# Patient Record
Sex: Female | Born: 1970 | Hispanic: No | Marital: Married | Smoking: Never smoker
Health system: Southern US, Community
[De-identification: ages and names within clinical notes are randomized; demographics above are authoritative.]

## PROBLEM LIST (undated history)

## (undated) DIAGNOSIS — E7800 Pure hypercholesterolemia, unspecified: Secondary | ICD-10-CM

## (undated) DIAGNOSIS — E139 Other specified diabetes mellitus without complications: Secondary | ICD-10-CM

## (undated) DIAGNOSIS — M545 Low back pain, unspecified: Secondary | ICD-10-CM

## (undated) HISTORY — DX: Pure hypercholesterolemia, unspecified: E78.00

## (undated) HISTORY — DX: Other specified diabetes mellitus without complications: E13.9

## (undated) HISTORY — DX: Low back pain, unspecified: M54.50

---

## 2019-05-02 ENCOUNTER — Emergency Department (HOSPITAL_BASED_OUTPATIENT_CLINIC_OR_DEPARTMENT_OTHER)
Admission: EM | Admit: 2019-05-02 | Discharge: 2019-05-02 | Disposition: A | Discharge status: Home / Self Care | Payer: No Typology Code available for payment source | Attending: Emergency Medicine | Admitting: Emergency Medicine

## 2019-05-02 ENCOUNTER — Emergency Department (HOSPITAL_BASED_OUTPATIENT_CLINIC_OR_DEPARTMENT_OTHER): Payer: No Typology Code available for payment source

## 2019-05-02 ENCOUNTER — Other Ambulatory Visit: Payer: Self-pay

## 2019-05-02 ENCOUNTER — Encounter (HOSPITAL_BASED_OUTPATIENT_CLINIC_OR_DEPARTMENT_OTHER): Payer: Self-pay | Admitting: *Deleted

## 2019-05-02 DIAGNOSIS — S5012XA Contusion of left forearm, initial encounter: Secondary | ICD-10-CM | POA: Diagnosis not present

## 2019-05-02 DIAGNOSIS — S01112A Laceration without foreign body of left eyelid and periocular area, initial encounter: Secondary | ICD-10-CM | POA: Diagnosis not present

## 2019-05-02 DIAGNOSIS — Z79899 Other long term (current) drug therapy: Secondary | ICD-10-CM | POA: Insufficient documentation

## 2019-05-02 DIAGNOSIS — S161XXA Strain of muscle, fascia and tendon at neck level, initial encounter: Secondary | ICD-10-CM | POA: Diagnosis not present

## 2019-05-02 DIAGNOSIS — S0990XA Unspecified injury of head, initial encounter: Secondary | ICD-10-CM | POA: Diagnosis present

## 2019-05-02 DIAGNOSIS — W19XXXA Unspecified fall, initial encounter: Secondary | ICD-10-CM

## 2019-05-02 DIAGNOSIS — Y929 Unspecified place or not applicable: Secondary | ICD-10-CM | POA: Insufficient documentation

## 2019-05-02 DIAGNOSIS — W3182XA Contact with other commercial machinery, initial encounter: Secondary | ICD-10-CM | POA: Insufficient documentation

## 2019-05-02 DIAGNOSIS — Z7984 Long term (current) use of oral hypoglycemic drugs: Secondary | ICD-10-CM | POA: Diagnosis not present

## 2019-05-02 DIAGNOSIS — Y990 Civilian activity done for income or pay: Secondary | ICD-10-CM | POA: Insufficient documentation

## 2019-05-02 DIAGNOSIS — S300XXA Contusion of lower back and pelvis, initial encounter: Secondary | ICD-10-CM | POA: Diagnosis not present

## 2019-05-02 DIAGNOSIS — Y9389 Activity, other specified: Secondary | ICD-10-CM | POA: Insufficient documentation

## 2019-05-02 DIAGNOSIS — S40022A Contusion of left upper arm, initial encounter: Secondary | ICD-10-CM

## 2019-05-02 DIAGNOSIS — S0181XA Laceration without foreign body of other part of head, initial encounter: Secondary | ICD-10-CM

## 2019-05-02 MED ORDER — METHOCARBAMOL 1000 MG/10ML IJ SOLN
500.0000 mg | Freq: Once | INTRAMUSCULAR | Status: AC
  Administered 2019-05-02: 500 mg via INTRAMUSCULAR
  Filled 2019-05-02: qty 10

## 2019-05-02 MED ORDER — IBUPROFEN 800 MG PO TABS
800.0000 mg | ORAL_TABLET | Freq: Three times a day (TID) | ORAL | 0 refills | Status: AC
Start: 2019-05-02 — End: ?

## 2019-05-02 MED ORDER — METHOCARBAMOL 500 MG PO TABS
500.0000 mg | ORAL_TABLET | Freq: Three times a day (TID) | ORAL | 0 refills | Status: AC | PRN
Start: 2019-05-02 — End: ?

## 2019-05-02 MED ORDER — KETOROLAC TROMETHAMINE 60 MG/2ML IM SOLN
60.0000 mg | Freq: Once | INTRAMUSCULAR | Status: AC
  Administered 2019-05-02: 15:00:00 60 mg via INTRAMUSCULAR
  Filled 2019-05-02: qty 2

## 2019-05-02 MED FILL — IBUPROFEN 800 MG TAB: 800 | 7 days supply | Qty: 21 | Fill #0

## 2019-05-02 MED FILL — METHOCARBAMOL 500 MG TABLET: 500 | 6 days supply | Qty: 20 | Fill #0

## 2019-05-02 NOTE — ED Provider Notes (Signed)
[facility] EMERGENCY DEPARTMENT Provider Note   CSN: [id number] Arrival date & time: 05/02/19  1158     History Chief Complaint  Patient presents with  . Fall    Sue Rhodes is a 49 y.o. female.  HPI Patient was at work.  She works feeding boxes into a machine.  Apparently there was a kick back that pushed her off balance.  She fell backwards and hit her left brow on a metal pole and fell backwards and hit her head on the ground.  She was dazed for about 30 seconds.  We will was then around her.  He was helped up into a chair.  She reports she has a lot of pain over much of the left side of her body.  She landed on her left shoulder and hip area.  She is already noticing bruising on the left arm that is uncomfortable.  She reports she has some headache at the back of her head.  She endorses pain at the back of her neck as well.  Is not on any blood thinners.    History reviewed. No pertinent past medical history.  There are no problems to display for this patient.   History reviewed. No pertinent surgical history.   OB History   No obstetric history on file.     No family history on file.  Social History   Tobacco Use  . Smoking status: Never Smoker  . Smokeless tobacco: Never Used  Substance Use Topics  . Alcohol use: Never  . Drug use: Never    Home Medications Prior to Admission medications   Medication Sig Start Date End Date Taking? Authorizing Provider  Blood Glucose Monitoring Suppl (GLUCOCOM BLOOD GLUCOSE MONITOR) DEVI Use as directed 03/05/18  Yes [provider]  dapagliflozin propanediol (FARXIGA) 5 MG TABS tablet Take by mouth. 06/13/18  Yes [provider]  etodolac (LODINE) 400 MG tablet Take by mouth. 09/13/17  Yes [provider]  ferrous sulfate 325 (65 FE) MG tablet Take by mouth. 06/13/18  Yes [provider]  fluticasone (FLONASE) 50 MCG/ACT nasal spray Place into the nose. 03/20/18  Yes [provider]  loratadine (CLARITIN) 10 MG tablet Take by mouth. 03/15/18  Yes [provider]  metFORMIN (GLUCOPHAGE-XR) 500 MG 24 hr tablet Take by mouth. 06/13/18  Yes [provider]  methocarbamol (ROBAXIN) 500 MG tablet Take 1 tablet twice per day as needed for spasms. 09/13/17  Yes [provider]  nortriptyline (PAMELOR) 10 MG capsule Take 2 tablets (20 mg) at night for headache. 09/11/18  Yes [provider]  rizatriptan (MAXALT) 10 MG tablet Take by mouth. 03/20/19  Yes [provider]  butalbital-acetaminophen-caffeine (FIORICET WITH CODEINE) 50-325-40-30 MG capsule Take 1 tablet po every 6 hours prn for headaches    [provider]    Allergies    Patient has no known allergies.  Review of Systems   Review of Systems 10 Systems reviewed and are negative for acute change except as noted in the HPI.  Physical Exam Updated Vital Signs BP 135/85 (BP Location: Right Arm)   Pulse 78   Temp 97.9 F (36.6 C) (Oral)   Resp 18   Ht 5' (1.524 m)   Wt 78.5 kg   SpO2 100%   BMI 33.79 kg/m   Physical Exam Constitutional:      Comments: Alert and appropriate.  GCS is 15.  No respiratory distress.  Patient has cervical collar  in place.  HENT:     Head:     Comments: 1 cm nonbleeding laceration to the left brow.  There is slight amount of gaping once this is cleaned.    Nose: Nose normal.     Mouth/Throat:     Mouth: Mucous membranes are moist.     Pharynx: Oropharynx is clear.  Eyes:     Extraocular Movements: Extraocular movements intact.     Conjunctiva/sclera: Conjunctivae normal.     Pupils: Pupils are equal, round, and reactive to light.  Neck:     Comments: Cervical collar maintained in place.  Patient endorses discomfort to palpation along the posterior cervical spine diffusely as well as the left paraspinous muscle bodies. Cardiovascular:     Rate and Rhythm: Normal rate and regular rhythm.     Pulses: Normal pulses.       Heart sounds: Normal heart sounds.  Pulmonary:     Effort: Pulmonary effort is normal.     Breath sounds: Normal breath sounds.     Comments: Patient dorsa some discomfort to compression of the chest wall.  No crepitus. Abdominal:     General: There is no distension.     Palpations: Abdomen is soft.     Tenderness: There is no abdominal tenderness. There is no guarding.  Musculoskeletal:     Comments: Diffuse ecchymotic bruising of the dorsal aspect of the forearm.  No lacerations or abrasions.  Patient has intact normal range of motion at the wrist hand elbow and shoulder.  Normal intact range of motion of hip knee and ankle.  No effusions or deformities present.  Skin:    General: Skin is warm and dry.  Neurological:     General: No focal deficit present.     Mental Status: She is oriented to person, place, and time.     Motor: No weakness.     Coordination: Coordination normal.  Psychiatric:        Mood and Affect: Mood normal.     ED Results / Procedures / Treatments   Labs (all labs ordered are listed, but only abnormal results are displayed) Labs Reviewed - No data to display  EKG None  Radiology CT Head Wo Contrast  Result Date: 05/02/2019 CLINICAL DATA:  MRI/MRA head 03/14/2018. EXAM: CT HEAD WITHOUT CONTRAST CT CERVICAL SPINE WITHOUT CONTRAST TECHNIQUE: Multidetector CT imaging of the head and cervical spine was performed following the standard protocol without intravenous contrast. Multiplanar CT image reconstructions of the cervical spine were also generated. COMPARISON:  Noncontrast head CT 02/01/2018. FINDINGS: CT HEAD FINDINGS Brain: There is no acute intracranial hemorrhage. No demarcated cortical infarct. No extra-axial fluid collection. No evidence of intracranial mass. No midline shift. Vascular: A persistent right trigeminal artery was better appreciated on prior MRA 03/14/2018 (anatomic variant). Expected proximal arterial flow voids. Skull: Normal. Negative  for fracture or focal lesion. Sinuses/Orbits: Visualized orbits show no acute finding. Minimal ethmoid sinus mucosal thickening. No significant mastoid effusion. CT CERVICAL SPINE FINDINGS Alignment: Straightening of the expected cervical lordosis. Trace C2-C3 and C3-C4 retrolisthesis. Skull base and vertebrae: The basion-dental and atlanto-dental intervals are maintained.No evidence of acute fracture to the cervical spine. Soft tissues and spinal canal: No prevertebral fluid or swelling. No visible canal hematoma. Disc levels: No significant bony spinal canal or neural foraminal narrowing at any level. Upper chest: No consolidation within the imaged lung apices. No visible pneumothorax. IMPRESSION: CT head: 1. No evidence of acute intracranial abnormality. 2. Minimal ethmoid sinus  mucosal thickening. CT cervical spine: 1. No evidence of acute fracture to the cervical spine. 2. Minimal C2-C3 and C3-C4 grade 1 retrolisthesis. Electronically Signed   By: Kellie Simmering DO   On: 05/02/2019 13:38   CT Cervical Spine Wo Contrast  Result Date: 05/02/2019 CLINICAL DATA:  MRI/MRA head 03/14/2018. EXAM: CT HEAD WITHOUT CONTRAST CT CERVICAL SPINE WITHOUT CONTRAST TECHNIQUE: Multidetector CT imaging of the head and cervical spine was performed following the standard protocol without intravenous contrast. Multiplanar CT image reconstructions of the cervical spine were also generated. COMPARISON:  Noncontrast head CT 02/01/2018. FINDINGS: CT HEAD FINDINGS Brain: There is no acute intracranial hemorrhage. No demarcated cortical infarct. No extra-axial fluid collection. No evidence of intracranial mass. No midline shift. Vascular: A persistent right trigeminal artery was better appreciated on prior MRA 03/14/2018 (anatomic variant). Expected proximal arterial flow voids. Skull: Normal. Negative for fracture or focal lesion. Sinuses/Orbits: Visualized orbits show no acute finding. Minimal ethmoid sinus mucosal thickening. No  significant mastoid effusion. CT CERVICAL SPINE FINDINGS Alignment: Straightening of the expected cervical lordosis. Trace C2-C3 and C3-C4 retrolisthesis. Skull base and vertebrae: The basion-dental and atlanto-dental intervals are maintained.No evidence of acute fracture to the cervical spine. Soft tissues and spinal canal: No prevertebral fluid or swelling. No visible canal hematoma. Disc levels: No significant bony spinal canal or neural foraminal narrowing at any level. Upper chest: No consolidation within the imaged lung apices. No visible pneumothorax. IMPRESSION: CT head: 1. No evidence of acute intracranial abnormality. 2. Minimal ethmoid sinus mucosal thickening. CT cervical spine: 1. No evidence of acute fracture to the cervical spine. 2. Minimal C2-C3 and C3-C4 grade 1 retrolisthesis. Electronically Signed   By: Kellie Simmering DO   On: 05/02/2019 13:38   DG Chest Port 1 View  Result Date: 05/02/2019 CLINICAL DATA:  Fall. Additional history provided: Patient fell this morning at work, reports neck pain. EXAM: PORTABLE CHEST 1 VIEW COMPARISON:  Chest radiograph 03/20/2018 FINDINGS: Heart size within normal limits. There is no appreciable airspace consolidation. No evidence of pleural effusion or pneumothorax. No acute bony abnormality identified. IMPRESSION: No evidence of acute cardiopulmonary abnormality. Electronically Signed   By: Kellie Simmering DO   On: 05/02/2019 13:29    Procedures .Marland KitchenLaceration Repair  Date/Time: 05/02/2019 3:13 PM Performed by: Charlesetta Shanks, MD Authorized by: Charlesetta Shanks, MD   Consent:    Consent obtained:  Verbal   Consent given by:  Patient   Risks discussed:  Infection and poor cosmetic result Anesthesia (see MAR for exact dosages):    Anesthesia method:  None Laceration details:    Location:  Face   Face location:  L eyebrow   Length (cm):  1   Depth (mm):  3 Repair type:    Repair type:  Simple Treatment:    Area cleansed with:  Shur-Clens    Amount of cleaning:  Standard Skin repair:    Repair method:  Tissue adhesive Approximation:    Approximation:  Close Post-procedure details:    Dressing:  Open (no dressing)   (including critical care time)  Medications Ordered in ED Medications  ketorolac (TORADOL) injection 60 mg (60 mg Intramuscular Given 05/02/19 1435)  methocarbamol (ROBAXIN) injection 500 mg (500 mg Intramuscular Given 05/02/19 1435)    ED Course  I have reviewed the triage vital signs and the nursing notes.  Pertinent labs & imaging results that were available during my care of the patient were reviewed by me and considered in my medical decision making (  see chart for details).    MDM Rules/Calculators/A&P                      Patient had a fall from standing position.  She had brief possible loss of consciousness.  Mental status is normal.  Patient is not anticoagulated.  CT head shows no acute findings.  She did also endorse pain in the back of the neck.  CT cervical spine does not identify any fractures.  Patient is neurologically intact.  He has some bruising to the forearm and tenderness on the left flank area but normal range of motion without clinical appearance of fractures or effusions at joints.  At this time recommendations for symptomatic treatment with ibuprofen, elevation and icing.  Return precautions provided. Final Clinical Impression(s) / ED Diagnoses Final diagnoses:  Fall, initial encounter  Minor head injury, initial encounter  Facial laceration, initial encounter  Acute strain of neck muscle, initial encounter  Contusion of left upper extremity, initial encounter  Contusion of lower back, initial encounter    Rx / DC Orders ED Discharge Orders    None       Arby Barrette, MD 05/02/19 1517

## 2019-05-02 NOTE — ED Triage Notes (Signed)
At work this am she fell. Laceration to her left eyebrow. Pain to her left forearm, knee and hip. She had LOC. She is alert and oriented on arrival. She had a UDS at occupational health.

## 2019-05-02 NOTE — ED Notes (Signed)
ED Provider at bedside.  Utilizing dermabond for wound closure.

## 2019-05-02 NOTE — ED Notes (Signed)
Small hard C collar applied at triage.

## 2019-05-02 NOTE — Discharge Instructions (Signed)
1.  Apply ice packs to areas that are bruised and swollen.  Try to elevate your arms or legs in areas that are swollen. 2.  Take ibuprofen for pain and Robaxin for muscle relaxer. 3.  Follow instructions for head injury and neck strain. 4.  Return to the emergency department if you have worsening or concerning symptoms. 5.  See your doctor for recheck in the next 2 to 3 days.

## 2019-05-02 NOTE — ED Notes (Signed)
ED Provider at bedside. 

## 2021-04-07 ENCOUNTER — Encounter: Payer: Self-pay | Admitting: Neurology

## 2021-04-08 ENCOUNTER — Other Ambulatory Visit (HOSPITAL_BASED_OUTPATIENT_CLINIC_OR_DEPARTMENT_OTHER): Payer: Self-pay | Admitting: Family Medicine

## 2021-04-08 DIAGNOSIS — Z1231 Encounter for screening mammogram for malignant neoplasm of breast: Secondary | ICD-10-CM

## 2021-04-11 ENCOUNTER — Ambulatory Visit (HOSPITAL_BASED_OUTPATIENT_CLINIC_OR_DEPARTMENT_OTHER)
Admission: RE | Admit: 2021-04-11 | Discharge: 2021-04-11 | Disposition: A | Discharge status: Home / Self Care | Payer: Medicaid Other | Source: Ambulatory Visit | Attending: Family Medicine | Admitting: Family Medicine

## 2021-04-11 ENCOUNTER — Encounter (HOSPITAL_BASED_OUTPATIENT_CLINIC_OR_DEPARTMENT_OTHER): Payer: Self-pay

## 2021-04-11 DIAGNOSIS — Z1231 Encounter for screening mammogram for malignant neoplasm of breast: Secondary | ICD-10-CM | POA: Insufficient documentation

## 2021-08-23 NOTE — Progress Notes (Unsigned)
NEUROLOGY CONSULTATION NOTE  Kevin Dorothee Napierkowski MRN: [id number] DOB: 1970-07-24  Referring provider: Payton Emerald, MD Primary care provider: Dannielle Karvonen, FNP  Reason for consult:  migraine  Assessment/Plan:   Migraine without aura, without status migrainosus, intractable  Right sided cervical radiculitis  Refer to physical therapy for neck pain and right sided cervical radiculitis Migraine prevention:  Restart nortriptyline 10mg  at bedtime (previously effective) Migraine rescue:  sumatriptan 50mg  and Zofran 4mg  Limit use of pain relievers to no more than 2 days out of week to prevent risk of rebound or medication-overuse headache. Keep headache diary Follow up 4 months.    Subjective:  Sue Rhodes is a 51 year old female with DM II and lumbar spinal stenosis who presents for migraines.  History supplemented by her accompanying son, referring provider's and prior neurologist's notes.  Interpreter is present.    Onset: Over 10 years, worsened in 2020 presenting with status migrainosus.  She did have a fall in 2020 hitting her head but caused back pain.   Location:  diffuse, right sided, (sometimes left sided), right retro-orbital sometimes radiating down the neck into right shoulder and down into all of the fingers.  No numbness or weakness.. Quality:  pressure, sharp, pounding Intensity:  gradual or sudden onset -severe.   Aura:  absent Prodrome:  absent Associated symptoms:  Nausea, vomiting, photophobia, phonophobia, sometimes dizziness and blurred vision.  She denies associated autonomic symptoms or unilateral numbness or weakness. Duration:  3-4 days Frequency:  every 4 to 5 days. Frequency of abortive medication: Tylenol or ibuprofen 3-4 times a week Triggers:  moving right arm/shoulder Relieving factors:  none Activity:  aggravates Treats headaches now with Tylenol  Followed by neurology at [facility] from 2020 to 2021.  At the time, she was doing  well on nortriptyline 30mg  at bedtime and rizatriptan 10mg  which helped but rizatriptan eventually caused chest pain.  Has seen ophthalmology with exams negative for papilledema.    Work-up: 03/14/2018 MRI BRAIN WO:  Normal 03/14/2018 MRA HEAD:  1. No emergent large vessel occlusion or flow-limiting stenosis.  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/02/2019 CT C-SPINE:  Minimal C2-C3 and C3-C4 grade 1 retrolisthesis  Past NSAIDS/analgesics:  Fioricet, ibuprofen, naproxen, etodolac, acetaminophen, tramadol Past abortive triptans:  rizatriptan 10mg  (helpful but caused chest pain) Past abortive ergotamine:  none Past muscle relaxants:  methocarbamol, cyclobenzaprine 10mg  QHS PRN (pain) Past anti-emetic:  promethazine Past antihypertensive medications:  none Past antidepressant medications:  nortriptyline 30mg  (effective), amitriptyline Past anticonvulsant medications:  topiramate Past anti-CGRP:  none Past antihistamines/decongestants:  Flonase, loratadine Other past therapies:  none  Current NSAIDS/analgesics:  Tylenol, Celebrex Current triptans:  none Current ergotamine:  none Current anti-emetic:  none Current muscle relaxants:  none Current Antihypertensive medications:  none Current Antidepressant medications:  none Current Anticonvulsant medications:  gabapentin 300mg  TID PRN (pain) Current anti-CGRP:  none Current Vitamins/Herbal/Supplements:  none Current Antihistamines/Decongestants:  none Other therapy:  none Hormone/birth control:  none   Caffeine:  1 cup coffee daily Depression/Anxiety:  yes Other pain:  back pain Sleep hygiene:  sleep schedule poor but sleeps 6-7 hours Family history of headache:  daughter      PAST MEDICAL HISTORY: Past Medical History:  Diagnosis Date   Chronic lower back pain    Diabetes 1.5, managed as type 2 (HCC)    High cholesterol      PAST SURGICAL HISTORY: No past surgical history on file.  MEDICATIONS: Current Outpatient  Medications on  File Prior to Visit  Medication Sig Dispense Refill   fluticasone (FLONASE) 50 MCG/ACT nasal spray Place into the nose.     ibuprofen (ADVIL) 800 MG tablet Take 1 tablet (800 mg total) by mouth 3 (three) times daily. 21 tablet 0   metFORMIN (GLUCOPHAGE-XR) 500 MG 24 hr tablet Take by mouth.     Blood Glucose Monitoring Suppl (GLUCOCOM BLOOD GLUCOSE MONITOR) DEVI Use as directed     butalbital-acetaminophen-caffeine (FIORICET WITH CODEINE) 50-325-40-30 MG capsule Take 1 tablet po every 6 hours prn for headaches (Patient not taking: Reported on 08/24/2021)     dapagliflozin propanediol (FARXIGA) 5 MG TABS tablet Take by mouth. (Patient not taking: Reported on 08/24/2021)     etodolac (LODINE) 400 MG tablet Take by mouth. (Patient not taking: Reported on 08/24/2021)     ferrous sulfate 325 (65 FE) MG tablet Take by mouth. (Patient not taking: Reported on 08/24/2021)     loratadine (CLARITIN) 10 MG tablet Take by mouth.     methocarbamol (ROBAXIN) 500 MG tablet Take 1 tablet twice per day as needed for spasms. (Patient not taking: Reported on 08/24/2021)     methocarbamol (ROBAXIN) 500 MG tablet Take 1 tablet (500 mg total) by mouth every 8 (eight) hours as needed for muscle spasms. (Patient not taking: Reported on 08/24/2021) 20 tablet 0   nortriptyline (PAMELOR) 10 MG capsule Take 2 tablets (20 mg) at night for headache. (Patient not taking: Reported on 08/24/2021)     rizatriptan (MAXALT) 10 MG tablet Take by mouth. (Patient not taking: Reported on 08/24/2021)     No current facility-administered medications on file prior to visit.      ALLERGIES: No Known Allergies  FAMILY HISTORY: No family history on file.  Objective:  Blood pressure 134/83, pulse 66, height 5\' 1"  (1.549 m), weight 177 lb (80.3 kg), SpO2 96 %. General: No acute distress.  Patient appears well-groomed.   Head:  Normocephalic/atraumatic Eyes:  fundi examined but not visualized Neck: supple, right sided  paraspinal tenderness, full range of motion Heart: regular rate and rhythm Neurological Exam: Mental status: alert and oriented to person, place, and time, speech fluent and not dysarthric, language intact. Cranial nerves: CN I: not tested CN II: pupils equal, round and reactive to light, visual fields intact CN III, IV, VI:  full range of motion, no nystagmus, no ptosis CN V: facial sensation intact. CN VII: upper and lower face symmetric CN VIII: hearing intact CN IX, X: gag intact, uvula midline CN XI: sternocleidomastoid and trapezius muscles intact CN XII: tongue midline Bulk & Tone: normal, no fasciculations. Motor:  muscle strength 5/5 throughout Sensation:  Pinprick, temperature and vibratory sensation intact. Deep Tendon Reflexes:  2+ throughout,  toes downgoing.   Finger to nose testing:  Without dysmetria.   Heel to shin:  Without dysmetria.   Gait:  Normal station and stride.  Romberg negative.    Thank you for allowing me to take part in the care of this patient.  , DO  CC:  Shon Millet, MD  Payton Emerald, PA-C

## 2021-08-24 ENCOUNTER — Encounter: Payer: Self-pay | Admitting: Neurology

## 2021-08-24 ENCOUNTER — Ambulatory Visit: Payer: 59 | Admitting: Neurology

## 2021-08-24 VITALS — BP 134/83 | HR 66 | Ht 61.0 in | Wt 177.0 lb

## 2021-08-24 DIAGNOSIS — G43019 Migraine without aura, intractable, without status migrainosus: Secondary | ICD-10-CM

## 2021-08-24 DIAGNOSIS — M542 Cervicalgia: Secondary | ICD-10-CM

## 2021-08-24 DIAGNOSIS — M5412 Radiculopathy, cervical region: Secondary | ICD-10-CM | POA: Diagnosis not present

## 2021-08-24 MED ORDER — SUMATRIPTAN SUCCINATE 50 MG PO TABS: ORAL_TABLET | ORAL | 5 refills | Status: AC

## 2021-08-24 MED ORDER — ONDANSETRON HCL 4 MG PO TABS: 4.0000 mg | ORAL_TABLET | Freq: Three times a day (TID) | ORAL | 5 refills | Status: AC | PRN

## 2021-08-24 MED ORDER — NORTRIPTYLINE HCL 10 MG PO CAPS: 10.0000 mg | ORAL_CAPSULE | Freq: Every day | ORAL | 5 refills | Status: AC

## 2021-08-24 NOTE — Patient Instructions (Signed)
Refer to physical therapy Start nortriptyline 10mg  at bedtime Take sumatriptan and ondansetron as needed for migraine attack and nausea Limit use of pain relievers to no more than 2 days out of week to prevent risk of rebound or medication-overuse headache. Follow up 4 months.

## 2021-08-26 ENCOUNTER — Encounter: Payer: Self-pay | Admitting: Surgery

## 2021-08-26 ENCOUNTER — Ambulatory Visit (INDEPENDENT_AMBULATORY_CARE_PROVIDER_SITE_OTHER): Payer: 59 | Admitting: Surgery

## 2021-08-26 ENCOUNTER — Ambulatory Visit: Payer: Self-pay

## 2021-08-26 VITALS — BP 118/79 | HR 75 | Ht 61.0 in | Wt 177.0 lb

## 2021-08-26 DIAGNOSIS — M4726 Other spondylosis with radiculopathy, lumbar region: Secondary | ICD-10-CM

## 2021-08-26 DIAGNOSIS — G8929 Other chronic pain: Secondary | ICD-10-CM | POA: Diagnosis not present

## 2021-08-26 DIAGNOSIS — M545 Low back pain, unspecified: Secondary | ICD-10-CM

## 2021-08-26 NOTE — Progress Notes (Signed)
Office Visit Note   Patient: Sue Rhodes           Date of Birth: 10-27-1970           MRN: [id number] Visit Date: 08/26/2021              Requested by: Meta Hatchet, PA-C [address] [place],  [place] [address] PCP: Meta Hatchet, PA-C   Assessment & Plan: Visit Diagnoses:  1. Chronic bilateral low back pain, unspecified whether sciatica present   2. Other spondylosis with radiculopathy, lumbar region     Plan: With patient's ongoing chronic low back pain and bilateral lower extremity radicular symptoms that have failed conservative treatment over several years I will schedule lumbar MRI to compare to the study that was done in 2021 and 2018.  Follow-up with Dr. Ophelia Charter in 4 weeks for recheck and to discuss results and further treatment options.  Follow-Up Instructions: Return in about 4 weeks (around 09/23/2021) for WITH DR YATES TO REVIEW LUMBAR MRI AND DISCUSS TREATMENT OPTIONS.   Orders:  Orders Placed This Encounter  Procedures   XR Lumbar Spine Complete   MR LUMBAR SPINE WO CONTRAST   No orders of the defined types were placed in this encounter.     Procedures: No procedures performed   Clinical Data: No additional findings.   Subjective: Chief Complaint  Patient presents with   Lower Back - Pain    HPI 51 year old female comes in with family member is helping as interpreter.  Patient was referred to [place] by primary care provider for ongoing chronic low back pain and bilateral lower extremity radiculopathy.  Patient has had chronic symptoms for over 5 years.  I reviewed her chart she had MRI scans lumbar spine in 2018 and 2021.  October 27, 2016 EXAM:  MRI LUMBAR SPINE WITHOUT CONTRAST   TECHNIQUE:  Multiplanar, multisequence MR imaging of the lumbar spine was  performed. No intravenous contrast was administered.   COMPARISON:  None.   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    Alignment:  Physiologic.   Vertebrae:  No fracture, evidence of discitis, or bone lesion.   Conus medullaris: Extends to the L1-L2 level and appears normal.   Paraspinal and other soft tissues: Negative.   Disc levels:   T11-T12:  Only seen on the sagittal images.  Negative.   T12-L1:  Only seen on the sagittal images.  Negative.   L1-L2:  Negative.   L2-L3:  Negative.   L3-L4: Diffuse disc bulge and bilateral facet arthropathy with  prominent posterior epidural fat resulting in moderate to severe  central spinal canal stenosis, effacement of the bilateral lateral  recesses, and mild right neuroforaminal stenosis.   L4-L5: Diffuse disc bulge and moderate bilateral facet arthropathy  resulting in moderate to severe central spinal canal stenosis,  moderate right, and mild left neuroforaminal stenosis.   L5-S1: Bilateral facet arthropathy. Prominent epidural fat. No  spinal canal or neuroforaminal stenosis.   IMPRESSION:  1. Lower lumbar degenerative changes with moderate to severe central  spinal canal stenosis at L3-L4 and L4-L5. Moderate right  neuroforaminal stenosis at L4-L5 which could impinge on the exiting  right L4 nerve root.    Electronically Signed    By: Obie Dredge M.D.    On: 10/27/2016 09:33 Specimen Collected: -- Last Resulted: --  Date: 10/27/16   Received From: [facility]    August 07, 2019: Virgina Jock II, DO - 08/07/2019  Formatting of this note might be different from the original.  MRI lumbar spine:   TECHNIQUE: Sagittal and axial T1 and T2-weighted sequences were performed. Additional sagittal STIR images were performed.   INDICATION: Lumbar strain   COMPARISON: None   FINDINGS:  #  Grade 1 retrolisthesis of L3-4 and grade 1 anterolisthesis L4-5.  #  Vertebral body heights are well maintained.  #  The marrow signal intensity is normal.  #  Conus terminates at L1 without evidence of tethering.  #  Nerve  roots appear normal.  #  Incidental findings: None.    #  L1-2: Normal.  #    #  L2-3: Normal.  #    #  L3-4: Mild degenerative disc disease. There is facet arthropathy and disc bulge causing mild central canal stenosis with mild bilateral neural foraminal narrowing  #    #  L4-5: Mild degenerative disc disease. There is facet arthropathy and disc bulge with posterior central protrusion causing moderate central canal and lateral recess stenosis with moderate bilateral neural foraminal narrowing  #    #  L5-S1: Facet arthropathy on the right without significant central canal stenosis or neuroforaminal narrowing    IMPRESSION:   1.  Lumbar spondylosis most significant at L4-5 with grade 1 anterolisthesis and disc bulge with posterior protrusion causing moderate central canal and lateral recess stenosis with moderate bilateral foraminal narrowing.   2.  L3-4 mild central canal stenosis and neuroforaminal narrowing.   Electronically Signed by: Abner Greenspan Exam End: 08/07/19 11:35    Patient states at one point she was told by an orthopedic surgeon that she would likely need surgical intervention for findings on the scan.  Patient said that she did not want to have any surgery done because he did not guarantee her "100% improvement".  Complains of constant low back pain that radiates down to both feet.  She has failed conservative treatment with Celebrex, ibuprofen, Robaxin.  Review of Systems No current complaints of cardiopulmonary GI/GU issues  Objective: Vital Signs: BP 118/79   Pulse 75   Ht 5\' 1"  (1.549 m)   Wt 177 lb (80.3 kg)   BMI 33.44 kg/m   Physical Exam HENT:     Head: Normocephalic.  Eyes:     Extraocular Movements: Extraocular movements intact.  Musculoskeletal:     Comments: Pain and limited range of motion with lumbar flexion and extension.  Positive bilateral lumbar paraspinal tenderness.  Positive bilateral static notch tenderness.  Negative logroll bilateral  hips.  Positive bilateral straight leg raise.  No focal motor deficits.  Neurological:     Mental Status: She is alert and oriented to person, place, and time.  Psychiatric:        Mood and Affect: Mood normal.     Ortho Exam  Specialty Comments:  No specialty comments available.  Imaging: No results found.   PMFS History: There are no problems to display for this patient.  Past Medical History:  Diagnosis Date   Chronic lower back pain    Diabetes 1.5, managed as type 2 (HCC)    High cholesterol     No family history on file.  No past surgical history on file. Social History   Occupational History   Not on file  Tobacco Use   Smoking status: Never   Smokeless tobacco: Never  Substance and Sexual Activity   Alcohol use: Never   Drug use:  Never   Sexual activity: Not on file

## 2021-09-21 ENCOUNTER — Ambulatory Visit: Payer: 59 | Admitting: Orthopaedic Surgery

## 2021-10-07 ENCOUNTER — Other Ambulatory Visit: Payer: Commercial Managed Care - HMO

## 2021-10-12 ENCOUNTER — Telehealth: Payer: Self-pay | Admitting: Neurology

## 2021-10-12 ENCOUNTER — Ambulatory Visit (INDEPENDENT_AMBULATORY_CARE_PROVIDER_SITE_OTHER): Payer: Commercial Managed Care - HMO

## 2021-10-12 DIAGNOSIS — G43019 Migraine without aura, intractable, without status migrainosus: Secondary | ICD-10-CM | POA: Diagnosis not present

## 2021-10-12 MED ORDER — KETOROLAC TROMETHAMINE 60 MG/2ML IM SOLN
60.0000 mg | Freq: Once | INTRAMUSCULAR | Status: AC
  Administered 2021-10-12: 60 mg via INTRAMUSCULAR

## 2021-10-12 MED ORDER — DIPHENHYDRAMINE HCL 50 MG/ML IJ SOLN
25.0000 mg | Freq: Once | INTRAMUSCULAR | Status: AC
  Administered 2021-10-12: 25 mg via INTRAMUSCULAR

## 2021-10-12 MED ORDER — METOCLOPRAMIDE HCL 5 MG/ML IJ SOLN
10.0000 mg | Freq: Once | INTRAMUSCULAR | Status: AC
  Administered 2021-10-12: 10 mg via INTRAMUSCULAR

## 2021-10-12 NOTE — Telephone Encounter (Signed)
If she has a driver, she may come in for a headache cocktail.  Otherwise, we can prescribe a prednisone taper but she will have to monitor her sugars as it may increase her sugar levels.  Son advised transferred to the front to schedule Cocktail- Headache.

## 2021-10-12 NOTE — Telephone Encounter (Signed)
Spoke to the patient son Nortriptyline  as prescription.  Patient did take the Sumatriptan and it did not work.   For the last two or three days patient unable to get out of the bed.  How frequent or the headaches (on average, how many days a week/month are they occurring)?  Patient doing well on the Medication up until three days ago.  How long do the headaches last?  Three days Verify what preventative medication and dose you are taking (e.g. topiramate, propranolol, amitriptyline, Emgality, etc)  Nortriptyline 10 mg Verify which rescue medication you are taking (triptan, Advil, Excedrin, Aleve, Ubrelvy, etc)  Sumatriptan  How often are you taking pain relievers/analgesics/rescue mediction?  NONE

## 2021-10-12 NOTE — Telephone Encounter (Signed)
The following message was left with [employer] on 10/12/21 at 12:45 PM.  Caller states his mom's headaches are getting worse and she needs a sooner appointment.  Routing to clinical for okay to move up if needed.

## 2021-10-17 ENCOUNTER — Other Ambulatory Visit: Payer: Commercial Managed Care - HMO

## 2021-11-18 ENCOUNTER — Telehealth: Payer: Self-pay | Admitting: Family Medicine

## 2021-11-18 NOTE — Telephone Encounter (Signed)
Received referral frm Pt's PCP / Dr. Chase Picket [facility] to see Dr.Schmitz.  --cld pt message left

## 2021-12-21 NOTE — Progress Notes (Deleted)
NEUROLOGY FOLLOW UP OFFICE NOTE  Sue Rhodes [id number]  Assessment/Plan:   Migraine without aura, without status migrainosus, intractable  Right sided cervical radiculitis   Refer to physical therapy for neck pain and right sided cervical radiculitis Migraine prevention:  Restart nortriptyline 10mg  at bedtime (previously effective) Migraine rescue:  sumatriptan 50mg  and Zofran 4mg  Limit use of pain relievers to no more than 2 days out of week to prevent risk of rebound or medication-overuse headache. Keep headache diary Follow up 4 months.       Subjective:  Sue Rhodes is a 51 year old female with DM II and lumbar spinal stenosis who follows up for migraines and cervical radiculitis.  History supplemented by her accompanying son.  Interpreter is present.   UPDATE: Referred to PT and restarted nortriptyline last visit.  Overall doing well. Intensity:  *** Duration:  *** Frequency:  ***  She did have an intractable migraine in October requiring a migraine cocktail (Toradol 60mg Lenard Galloway 25mg /Reglan 10mg )  Frequency of abortive medication: *** Current NSAIDS/analgesics:  Tylenol, Celebrex Current triptans:  sumatriptan 50mg  Current ergotamine:  none Current anti-emetic:  Zofran 4mg  Current muscle relaxants:  none Current Antihypertensive medications:  none Current Antidepressant medications:  none Current Anticonvulsant medications:  gabapentin 300mg  TID PRN (pain) Current anti-CGRP:  none Current Vitamins/Herbal/Supplements:  none Current Antihistamines/Decongestants:  none Other therapy:  none Hormone/birth control:  none     Caffeine:  1 cup coffee daily Depression/Anxiety:  yes Other pain:  back pain Sleep hygiene:  sleep schedule poor but sleeps 6-7 hours   HISTORY:  Onset: Over 10 years, worsened in 2020 presenting with status migrainosus.  She did have a fall in 2020 hitting her head but caused back pain.   Location:  diffuse, right sided,  (sometimes left sided), right retro-orbital sometimes radiating down the neck into right shoulder and down into all of the fingers.  No numbness or weakness.. Quality:  pressure, sharp, pounding Intensity:  gradual or sudden onset -severe.   Aura:  absent Prodrome:  absent Associated symptoms:  Nausea, vomiting, photophobia, phonophobia, sometimes dizziness and blurred vision.  She denies associated autonomic symptoms or unilateral numbness or weakness. Duration:  3-4 days Frequency:  every 4 to 5 days. Frequency of abortive medication: Tylenol or ibuprofen 3-4 times a week Triggers:  moving right arm/shoulder Relieving factors:  none Activity:  aggravates Treats headaches now with Tylenol   Followed by neurology at [facility] from 2020 to 2021.  At the time, she was doing well on nortriptyline 30mg  at bedtime and rizatriptan 10mg  which helped but rizatriptan eventually caused chest pain.  Has seen ophthalmology with exams negative for papilledema.     Work-up: 03/14/2018 MRI BRAIN WO:  Normal 03/14/2018 MRA HEAD:  1. No emergent large vessel occlusion or flow-limiting stenosis.  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/02/2019 CT C-SPINE:  Minimal C2-C3 and C3-C4 grade 1 retrolisthesis   Past NSAIDS/analgesics:  Fioricet, ibuprofen, naproxen, etodolac, acetaminophen, tramadol Past abortive triptans:  rizatriptan 10mg  (helpful but caused chest pain) Past abortive ergotamine:  none Past muscle relaxants:  methocarbamol, cyclobenzaprine 10mg  QHS PRN (pain) Past anti-emetic:  promethazine Past antihypertensive medications:  none Past antidepressant medications:  amitriptyline Past anticonvulsant medications:  topiramate Past anti-CGRP:  none Past antihistamines/decongestants:  Flonase, loratadine Other past therapies:  none    Family history of headache:  daughter  PAST MEDICAL HISTORY: Past Medical History:  Diagnosis Date   Chronic lower back pain  Diabetes 1.5,  managed as type 2 (HCC)    High cholesterol     MEDICATIONS: Current Outpatient Medications on File Prior to Visit  Medication Sig Dispense Refill   atorvastatin (LIPITOR) 40 MG tablet Take 40 mg by mouth at bedtime.     Blood Glucose Monitoring Suppl (GLUCOCOM BLOOD GLUCOSE MONITOR) DEVI Use as directed     butalbital-acetaminophen-caffeine (FIORICET WITH CODEINE) 50-325-40-30 MG capsule Take 1 tablet po every 6 hours prn for headaches (Patient not taking: Reported on 08/24/2021)     celecoxib (CELEBREX) 200 MG capsule Take 200 mg by mouth 2 (two) times daily.     dapagliflozin propanediol (FARXIGA) 5 MG TABS tablet Take by mouth. (Patient not taking: Reported on 08/24/2021)     etodolac (LODINE) 400 MG tablet Take by mouth. (Patient not taking: Reported on 08/24/2021)     ferrous sulfate 325 (65 FE) MG tablet Take by mouth. (Patient not taking: Reported on 08/24/2021)     fluticasone (FLONASE) 50 MCG/ACT nasal spray Place into the nose.     ibuprofen (ADVIL) 800 MG tablet Take 1 tablet (800 mg total) by mouth 3 (three) times daily. 21 tablet 0   loratadine (CLARITIN) 10 MG tablet Take by mouth.     metFORMIN (GLUCOPHAGE-XR) 500 MG 24 hr tablet Take by mouth.     methocarbamol (ROBAXIN) 500 MG tablet Take 1 tablet twice per day as needed for spasms. (Patient not taking: Reported on 08/24/2021)     methocarbamol (ROBAXIN) 500 MG tablet Take 1 tablet (500 mg total) by mouth every 8 (eight) hours as needed for muscle spasms. (Patient not taking: Reported on 08/24/2021) 20 tablet 0   nortriptyline (PAMELOR) 10 MG capsule Take 1 capsule (10 mg total) by mouth at bedtime. 30 capsule 5   ondansetron (ZOFRAN) 4 MG tablet Take 1 tablet (4 mg total) by mouth every 8 (eight) hours as needed for nausea or vomiting. 20 tablet 5   SUMAtriptan (IMITREX) 50 MG tablet Take 1 tablet earliest onset of migraine.  May repeat in 2 hours if headache persists or recurs.  Maximum 2 tablets in 24 hours. 10 tablet 5   No  current facility-administered medications on file prior to visit.    ALLERGIES: No Known Allergies  FAMILY HISTORY: No family history on file.    Objective:  *** General: No acute distress.  Patient appears well-groomed.   Head:  Normocephalic/atraumatic Eyes:  Fundi examined but not visualized Neck: supple, no paraspinal tenderness, full range of motion Heart:  Regular rate and rhythm Neurological Exam: ***   Shon Millet, DO  CC: Meta Hatchet, PA

## 2021-12-27 ENCOUNTER — Encounter: Payer: Self-pay | Admitting: Neurology

## 2021-12-27 ENCOUNTER — Ambulatory Visit: Payer: Commercial Managed Care - HMO | Admitting: Neurology

## 2021-12-27 DIAGNOSIS — Z029 Encounter for administrative examinations, unspecified: Secondary | ICD-10-CM

## 2022-04-18 ENCOUNTER — Encounter: Payer: Self-pay | Admitting: *Deleted

## 2022-10-12 ENCOUNTER — Encounter (HOSPITAL_COMMUNITY): Payer: Self-pay | Admitting: Family Medicine

## 2022-10-12 DIAGNOSIS — R0789 Other chest pain: Secondary | ICD-10-CM

## 2022-10-19 ENCOUNTER — Other Ambulatory Visit (HOSPITAL_COMMUNITY): Payer: Self-pay | Admitting: Family Medicine

## 2022-10-19 DIAGNOSIS — R0789 Other chest pain: Secondary | ICD-10-CM

## 2022-12-04 ENCOUNTER — Telehealth (HOSPITAL_COMMUNITY): Payer: Self-pay | Admitting: *Deleted

## 2022-12-04 MED ORDER — METOPROLOL TARTRATE 50 MG PO TABS: ORAL_TABLET | ORAL | 0 refills | Status: AC

## 2022-12-04 NOTE — Telephone Encounter (Signed)
Reaching out to patient to offer assistance regarding upcoming cardiac imaging study; spoke to son (ok per DPR) verbalizes understanding of appt date/time, parking situation and where to check in, pre-test NPO status and medications ordered, and verified current allergies; name and call back number provided for further questions should they arise Johney Frame RN Navigator Cardiac Imaging [facility] [phone] office [phone] cell

## 2022-12-05 ENCOUNTER — Ambulatory Visit (HOSPITAL_BASED_OUTPATIENT_CLINIC_OR_DEPARTMENT_OTHER): Admission: RE | Admit: 2022-12-05 | Payer: BLUE CROSS/BLUE SHIELD | Source: Ambulatory Visit

## 2022-12-18 ENCOUNTER — Ambulatory Visit (HOSPITAL_COMMUNITY): Payer: BLUE CROSS/BLUE SHIELD

## 2023-01-07 ENCOUNTER — Encounter (HOSPITAL_COMMUNITY): Payer: Self-pay

## 2023-02-16 ENCOUNTER — Ambulatory Visit (HOSPITAL_COMMUNITY): Admission: RE | Admit: 2023-02-16 | Payer: BLUE CROSS/BLUE SHIELD | Source: Ambulatory Visit

## 2023-03-28 NOTE — Progress Notes (Deleted)
 NEUROLOGY FOLLOW UP OFFICE NOTE  Sue Rhodes [id number]  Assessment/Plan:   Migraine without aura, without status migrainosus, intractable  Right sided cervical radiculitis  Refer to physical therapy for neck pain and right sided cervical radiculitis Migraine prevention:  Restart nortriptyline 10mg  at bedtime (previously effective) Migraine rescue:  sumatriptan 50mg  and Zofran 4mg  Limit use of pain relievers to no more than 2 days out of week to prevent risk of rebound or medication-overuse headache. Keep headache diary Follow up 4 months.    Subjective:  Sue Rhodes is a 53 year old female with DM II and lumbar spinal stenosis who follows up for headache.  ED note reviewed.  UPDATE: Last seen in initial consultation in August 2023. At that time, restarted nortriptyline  Intensity:  *** Duration:  *** Frequency:  ***  Seen in the ED on 12/07/2022 for headache and treated with migraine cocktail.    Frequency of abortive medication: *** Current NSAIDS/analgesics:  Tylenol, Celebrex Current triptans:  sumatriptan 50mg  *** Current ergotamine:  none Current anti-emetic:  Zofran 4mg  *** Current muscle relaxants:  none Current Antihypertensive medications:  none Current Antidepressant medications:  nortriptyline 10mg  at bedtime *** Current Anticonvulsant medications:  gabapentin 300mg  TID PRN (pain) Current anti-CGRP:  none Current Vitamins/Herbal/Supplements:  none Current Antihistamines/Decongestants:  none Other therapy:  none Hormone/birth control:  none   Caffeine:  1 cup coffee daily Depression/Anxiety:  yes Other pain:  back pain Sleep hygiene:  sleep schedule poor but sleeps 6-7 hours  HISTORY: Onset: Over 10 years, worsened in 2020 presenting with status migrainosus.  She did have a fall in 2020 hitting her head but caused back pain.   Location:  diffuse, right sided, (sometimes left sided), right retro-orbital sometimes radiating down the neck into  right shoulder and down into all of the fingers.  No numbness or weakness.. Quality:  pressure, sharp, pounding Intensity:  gradual or sudden onset -severe.   Aura:  absent Prodrome:  absent Associated symptoms:  Nausea, vomiting, photophobia, phonophobia, sometimes dizziness and blurred vision.  She denies associated autonomic symptoms or unilateral numbness or weakness. Duration:  3-4 days Frequency:  every 4 to 5 days. Frequency of abortive medication: Tylenol or ibuprofen 3-4 times a week Triggers:  moving right arm/shoulder Relieving factors:  none Activity:  aggravates Treats headaches now with Tylenol  Followed by neurology at [facility] from 2020 to 2021.  At the time, she was doing well on nortriptyline 30mg  at bedtime and rizatriptan 10mg  which helped but rizatriptan eventually caused chest pain.  Has seen ophthalmology with exams negative for papilledema.    Work-up: 03/14/2018 MRI BRAIN WO:  Normal 03/14/2018 MRA HEAD:  1. No emergent large vessel occlusion or flow-limiting stenosis.  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/02/2019 CT C-SPINE:  Minimal C2-C3 and C3-C4 grade 1 retrolisthesis  Past NSAIDS/analgesics:  Fioricet, ibuprofen, naproxen, etodolac, acetaminophen, tramadol, Toradol inj Past abortive triptans:  rizatriptan 10mg  (helpful but caused chest pain) Past abortive ergotamine:  none Past muscle relaxants:  methocarbamol, cyclobenzaprine 10mg  QHS PRN (pain) Past anti-emetic:  promethazine Past antihypertensive medications:  none Past antidepressant medications:  nortriptyline 30mg  (effective), amitriptyline Past anticonvulsant medications:  topiramate Past anti-CGRP:  none Past antihistamines/decongestants:  Flonase, loratadine, Benadryl Other past therapies:  none   Family history of headache:  daughter  PAST MEDICAL HISTORY: Past Medical History:  Diagnosis Date   Chronic lower back pain    Diabetes 1.5, managed as type 2 (HCC)  High  cholesterol     MEDICATIONS: Current Outpatient Medications on File Prior to Visit  Medication Sig Dispense Refill   atorvastatin (LIPITOR) 40 MG tablet Take 40 mg by mouth at bedtime.     Blood Glucose Monitoring Suppl (GLUCOCOM BLOOD GLUCOSE MONITOR) DEVI Use as directed     butalbital-acetaminophen-caffeine (FIORICET WITH CODEINE) 50-325-40-30 MG capsule Take 1 tablet po every 6 hours prn for headaches (Patient not taking: Reported on 08/24/2021)     celecoxib (CELEBREX) 200 MG capsule Take 200 mg by mouth 2 (two) times daily.     dapagliflozin propanediol (FARXIGA) 5 MG TABS tablet Take by mouth. (Patient not taking: Reported on 08/24/2021)     etodolac (LODINE) 400 MG tablet Take by mouth. (Patient not taking: Reported on 08/24/2021)     ferrous sulfate 325 (65 FE) MG tablet Take by mouth. (Patient not taking: Reported on 08/24/2021)     fluticasone (FLONASE) 50 MCG/ACT nasal spray Place into the nose.     ibuprofen (ADVIL) 800 MG tablet Take 1 tablet (800 mg total) by mouth 3 (three) times daily. 21 tablet 0   loratadine (CLARITIN) 10 MG tablet Take by mouth.     metFORMIN (GLUCOPHAGE-XR) 500 MG 24 hr tablet Take by mouth.     methocarbamol (ROBAXIN) 500 MG tablet Take 1 tablet twice per day as needed for spasms. (Patient not taking: Reported on 08/24/2021)     methocarbamol (ROBAXIN) 500 MG tablet Take 1 tablet (500 mg total) by mouth every 8 (eight) hours as needed for muscle spasms. (Patient not taking: Reported on 08/24/2021) 20 tablet 0   metoprolol tartrate (LOPRESSOR) 50 MG tablet Take 50 mg (1 tablet) TWO hours prior to CT scan 1 tablet 0   nortriptyline (PAMELOR) 10 MG capsule Take 1 capsule (10 mg total) by mouth at bedtime. 30 capsule 5   ondansetron (ZOFRAN) 4 MG tablet Take 1 tablet (4 mg total) by mouth every 8 (eight) hours as needed for nausea or vomiting. 20 tablet 5   SUMAtriptan (IMITREX) 50 MG tablet Take 1 tablet earliest onset of migraine.  May repeat in 2 hours if  headache persists or recurs.  Maximum 2 tablets in 24 hours. 10 tablet 5   No current facility-administered medications on file prior to visit.    ALLERGIES: No Known Allergies  FAMILY HISTORY: No family history on file.    Objective:  *** General: No acute distress.  Patient appears ***-groomed.   Head:  Normocephalic/atraumatic Eyes:  Fundi examined but not visualized Neck: supple, no paraspinal tenderness, full range of motion Heart:  Regular rate and rhythm Lungs:  Clear to auscultation bilaterally Back: No paraspinal tenderness Neurological Exam: alert and oriented.  Speech fluent and not dysarthric, language intact.  CN II-XII intact. Bulk and tone normal, muscle strength 5/5 throughout.  Sensation to light touch intact.  Deep tendon reflexes 2+ throughout, toes downgoing.  Finger to nose testing intact.  Gait normal, Romberg negative.   Shon Millet, DO  CC: ***

## 2023-03-30 ENCOUNTER — Encounter: Payer: Self-pay | Admitting: Neurology

## 2023-03-30 ENCOUNTER — Ambulatory Visit: Payer: Commercial Managed Care - HMO | Admitting: Neurology

## 2023-08-09 IMAGING — MG MM DIGITAL SCREENING BILAT W/ TOMO AND CAD
8 of 15 series · 8 of 40 positions shown · non-contrast
Comparison: None.

ACR Breast Density Category a: The breast tissue is almost entirely
fatty.

CLINICAL DATA: Screening.

EXAM:
DIGITAL SCREENING BILATERAL MAMMOGRAM WITH TOMOSYNTHESIS AND CAD
TECHNIQUE: Bilateral screening digital craniocaudal and mediolateral oblique
mammograms were obtained. Bilateral screening digital breast
tomosynthesis was performed. The images were evaluated with
computer-aided detection.

[R CC synth-2D]
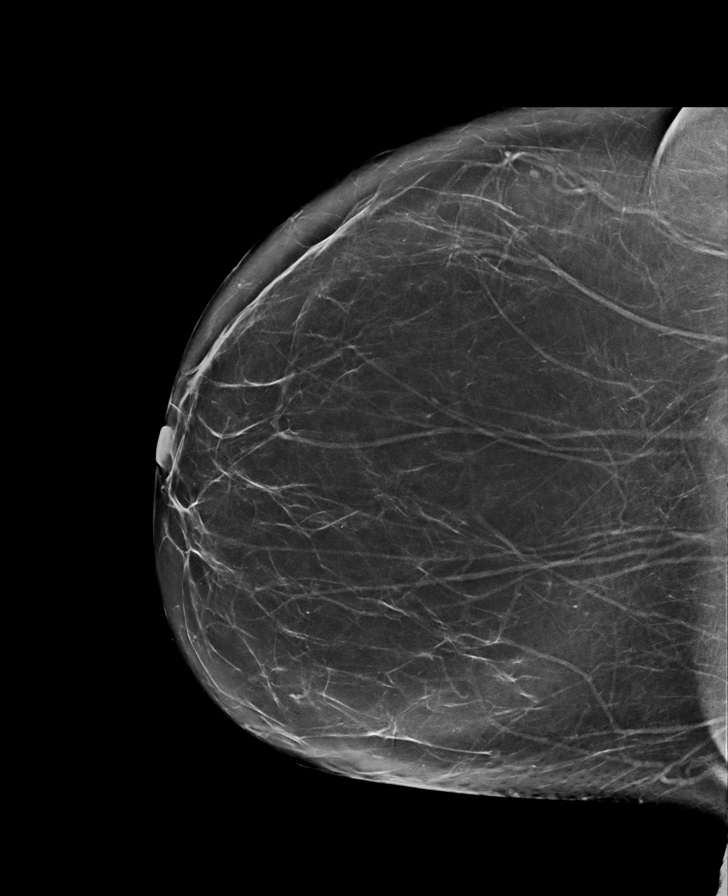

[R MLO synth-2D (1 of 2)]
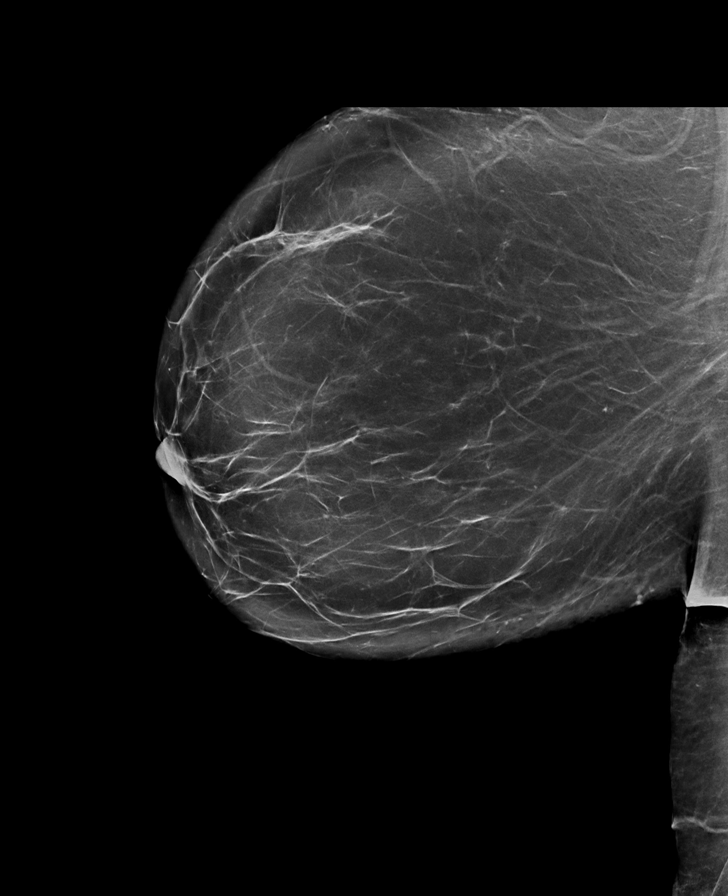

[L CC synth-2D]
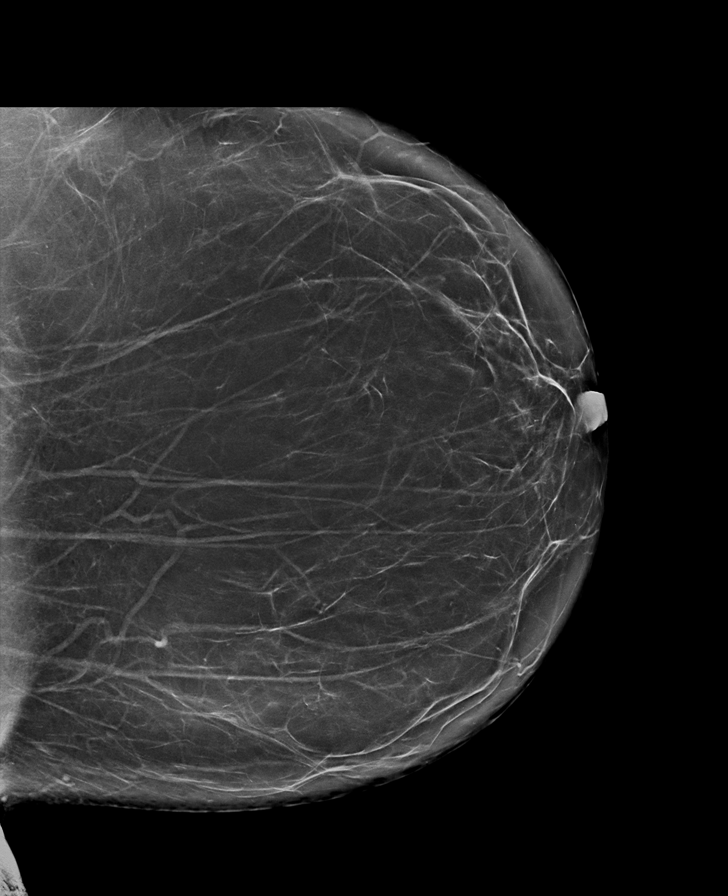

[L MLO synth-2D (1 of 3)]
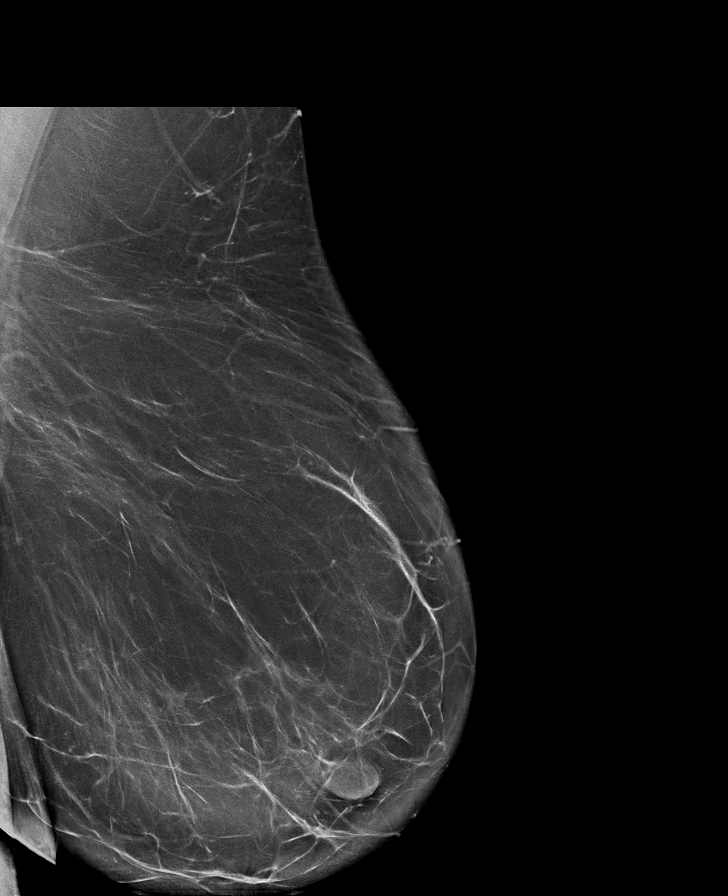

[L MLO synth-2D (2 of 3)]
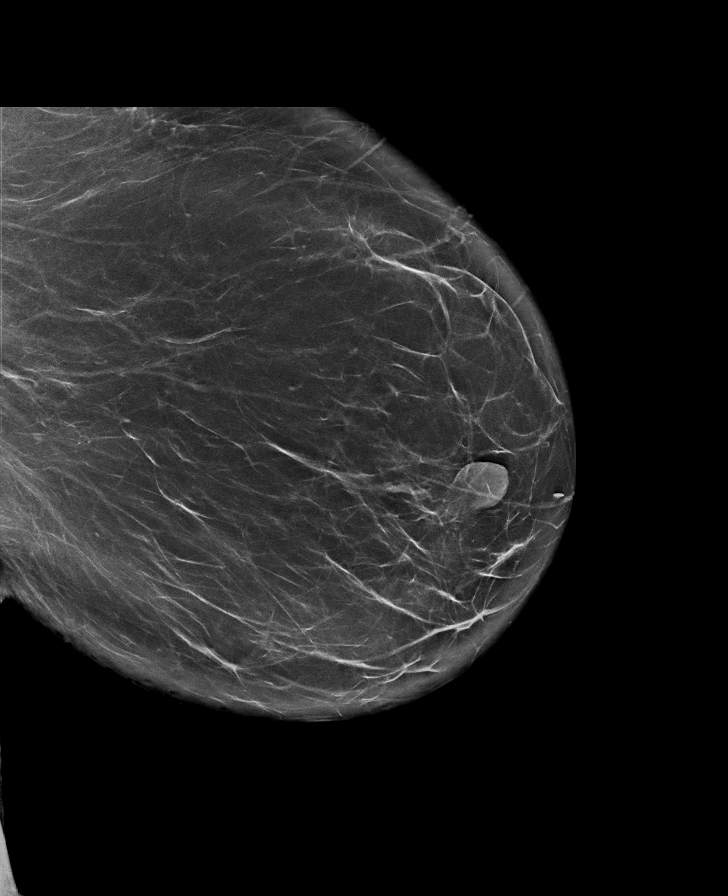

[L MLO synth-2D (3 of 3)]
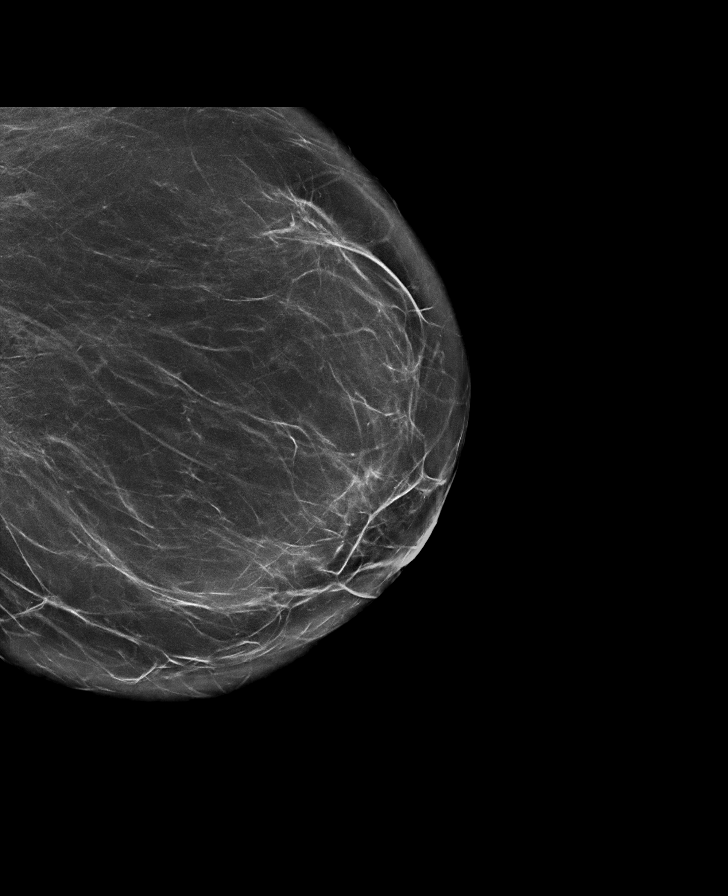

[R MLO synth-2D (2 of 2)]
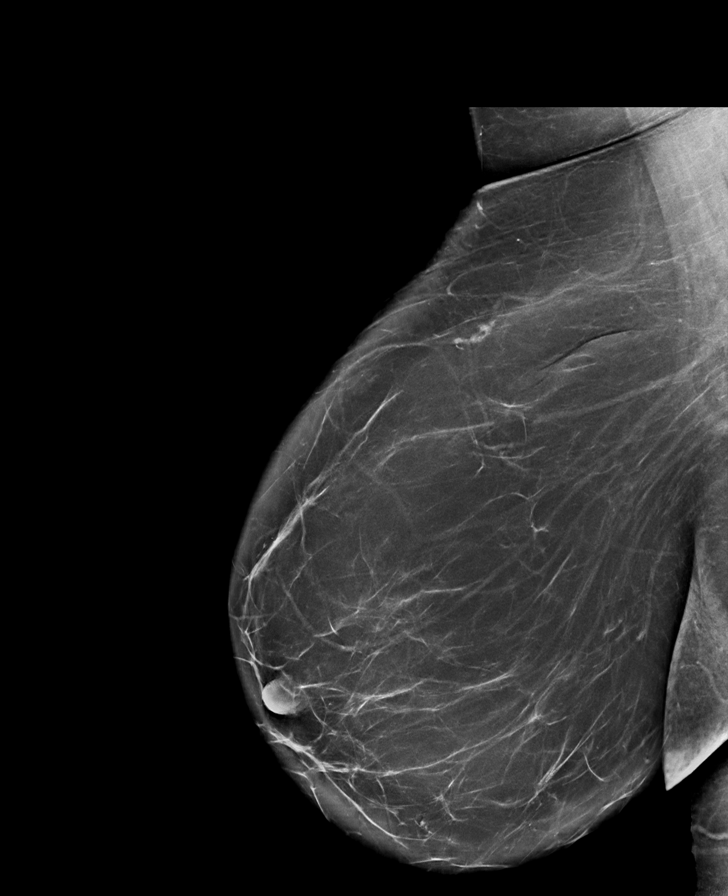

[R MLO tomo · tomo slice 55/80.0]
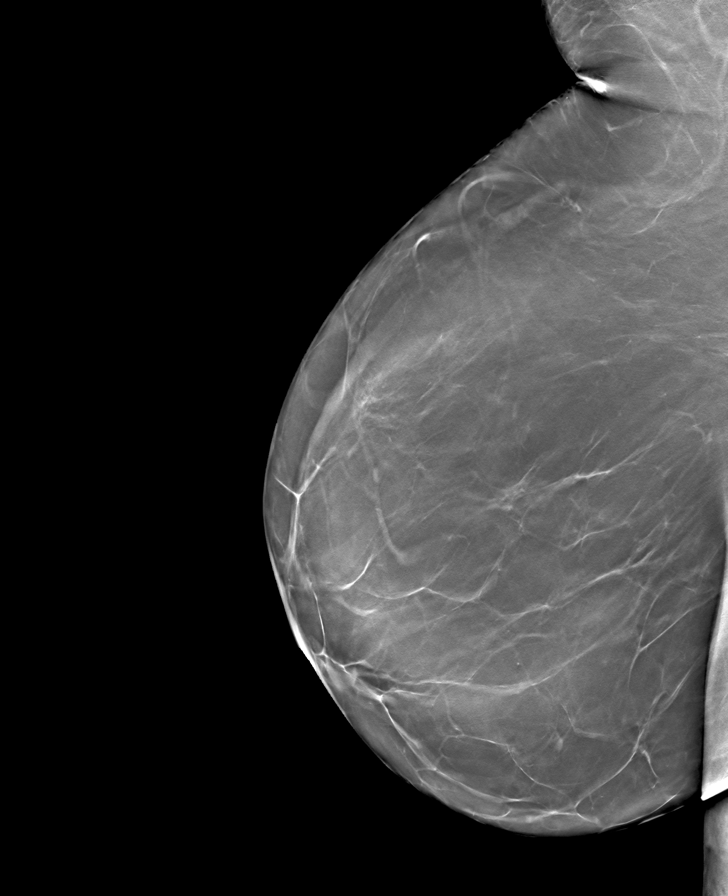

[8 of 40 positions shown; findings below may reference images not displayed]

FINDINGS: There are no findings suspicious for malignancy.
IMPRESSION: No mammographic evidence of malignancy. A result letter of this
screening mammogram will be mailed directly to the patient.

RECOMMENDATION:
Screening mammogram in one year. (Code:44-M-M6Q)

BI-RADS CATEGORY  1: Negative.
